# Patient Record
Sex: Male | Born: 1985 | Race: White | Hispanic: No | Marital: Single | Smoking: Former smoker
Health system: Southern US, Community
[De-identification: ages and names within clinical notes are randomized; demographics above are authoritative.]

## PROBLEM LIST (undated history)

## (undated) DIAGNOSIS — M109 Gout, unspecified: Secondary | ICD-10-CM

## (undated) DIAGNOSIS — I10 Essential (primary) hypertension: Secondary | ICD-10-CM

## (undated) HISTORY — DX: Gout, unspecified: M10.9

---

## 2010-06-10 ENCOUNTER — Emergency Department (HOSPITAL_COMMUNITY): Admission: EM | Admit: 2010-06-10 | Discharge: 2010-06-10 | Payer: Self-pay | Admitting: Emergency Medicine

## 2015-02-02 ENCOUNTER — Encounter: Payer: Self-pay | Admitting: Podiatry

## 2015-02-02 ENCOUNTER — Ambulatory Visit (INDEPENDENT_AMBULATORY_CARE_PROVIDER_SITE_OTHER): Payer: PRIVATE HEALTH INSURANCE

## 2015-02-02 ENCOUNTER — Ambulatory Visit (INDEPENDENT_AMBULATORY_CARE_PROVIDER_SITE_OTHER): Payer: PRIVATE HEALTH INSURANCE | Admitting: Podiatry

## 2015-02-02 VITALS — BP 139/91 | HR 77 | Resp 16 | Ht 74.0 in | Wt 220.0 lb

## 2015-02-02 DIAGNOSIS — M779 Enthesopathy, unspecified: Secondary | ICD-10-CM

## 2015-02-02 DIAGNOSIS — M79672 Pain in left foot: Secondary | ICD-10-CM

## 2015-02-02 DIAGNOSIS — M1A0720 Idiopathic chronic gout, left ankle and foot, without tophus (tophi): Secondary | ICD-10-CM

## 2015-02-02 MED ORDER — INDOMETHACIN 50 MG PO CAPS: 50.0000 mg | ORAL_CAPSULE | Freq: Two times a day (BID) | ORAL | Status: DC

## 2015-02-02 NOTE — Patient Instructions (Signed)
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
## 2015-02-03 LAB — ANA: ANA: NEGATIVE

## 2015-02-03 LAB — C-REACTIVE PROTEIN: CRP: 9.6 mg/L — AB (ref 0.0–4.9)

## 2015-02-03 LAB — RHEUMATOID FACTOR: Rhuematoid fact SerPl-aCnc: <7 IU/mL (ref 0.0–13.9)

## 2015-02-03 LAB — URIC ACID: Uric Acid: 7.9 mg/dL (ref 3.7–8.6)

## 2015-02-03 LAB — SEDIMENTATION RATE: Sed Rate: 4 mm/hr (ref 0–15)

## 2015-02-05 ENCOUNTER — Other Ambulatory Visit: Payer: Self-pay | Admitting: Podiatry

## 2015-02-05 MED ORDER — COLCHICINE 0.6 MG PO TABS: 0.6000 mg | ORAL_TABLET | Freq: Every day | ORAL | Status: DC

## 2015-05-23 ENCOUNTER — Other Ambulatory Visit: Payer: Self-pay | Admitting: Podiatry

## 2015-05-23 MED ORDER — INDOMETHACIN 50 MG PO CAPS: 50.0000 mg | ORAL_CAPSULE | Freq: Two times a day (BID) | ORAL | Status: DC

## 2015-05-24 ENCOUNTER — Other Ambulatory Visit: Payer: Self-pay | Admitting: Podiatry

## 2016-05-06 ENCOUNTER — Other Ambulatory Visit: Payer: Self-pay | Admitting: Podiatry

## 2016-10-16 ENCOUNTER — Encounter: Payer: Self-pay | Admitting: Podiatry

## 2016-10-16 ENCOUNTER — Ambulatory Visit (INDEPENDENT_AMBULATORY_CARE_PROVIDER_SITE_OTHER): Payer: 59 | Admitting: Podiatry

## 2016-10-16 DIAGNOSIS — M7752 Other enthesopathy of left foot: Secondary | ICD-10-CM

## 2016-10-16 DIAGNOSIS — M10072 Idiopathic gout, left ankle and foot: Secondary | ICD-10-CM | POA: Diagnosis not present

## 2016-10-16 DIAGNOSIS — R600 Localized edema: Secondary | ICD-10-CM

## 2016-10-16 MED ORDER — COLCHICINE 0.6 MG PO TABS: 0.6000 mg | ORAL_TABLET | Freq: Every day | ORAL | 1 refills | Status: DC

## 2016-10-16 MED ORDER — BETAMETHASONE SOD PHOS & ACET 6 (3-3) MG/ML IJ SUSP: 3.0000 mg | Freq: Once | INTRAMUSCULAR | Status: DC

## 2016-10-16 MED ORDER — ALLOPURINOL 100 MG PO TABS: 100.0000 mg | ORAL_TABLET | Freq: Every day | ORAL | 6 refills | Status: DC

## 2016-10-16 NOTE — Progress Notes (Signed)
   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 Patient believes she has a gout attack to the left ankle joint. Patient denies trauma and states the pains been going on for several weeks now. Patient has been taking colchicine and indomethacin without any alleviation of symptoms. 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 Uric acid levels were taken on 02/02/2015. Results were 7.9 mg/dL consistent with elevated uric acid levels.    Objective/Physical Exam General: The patient is alert and oriented x3 in no acute distress.  Dermatology: Skin is warm, dry and supple bilateral lower extremities. Negative for open lesions or macerations.  Vascular: Palpable pedal pulses bilaterally. No edema or erythema noted. Capillary refill within normal limits.  Neurological: Epicritic and protective threshold grossly intact bilaterally.   Musculoskeletal Exam: Pain on palpation noted to the anterior medial and lateral aspect of the patient's left ankle joint as well as edema noted to the left ankle joint. Range of motion within normal limits to all pedal and ankle joints bilateral. Muscle strength 5/5 in all groups bilateral.   Assessment: #1 acute gout left ankle joint   Plan of Care:  #1 Patient was evaluated. #2 injection 0.5 mL Celestone Soluspan injected in the patient's right ankle joint #3 prescription for Colcrys0.6 mg #4 prescription for allopurinol #5 today were going to refer the patient to a rheumatologist for rheumatology consult. #6 return to clinic when necessary   Felecia ShellingBrent M. Evans, DPM [facility]  Dr. Felecia ShellingBrent M. Evans, DPM    [address]                                        [place], [place] [address]                Office [phone]  Fax [phone]

## 2016-10-17 ENCOUNTER — Telehealth: Payer: Self-pay | Admitting: *Deleted

## 2016-10-17 NOTE — Telephone Encounter (Addendum)
-----   Message from Felecia ShellingBrent M Evans, DPM sent at 10/16/2016  9:49 PM EST ----- Regarding: Rheumatology consult Please refer patient to Rheumatology consult, Dr. Alben DeedsJames Beekman, Rheumatologist.  Dx: recurrent acute gout attacks left lower extremity  Thanks, Dr. Logan BoresEvans. 10/17/2016-Faxed required form, clinicals and demographics [facility].

## 2017-05-14 ENCOUNTER — Ambulatory Visit: Payer: 59 | Admitting: Podiatry

## 2017-06-15 ENCOUNTER — Other Ambulatory Visit: Payer: Self-pay | Admitting: Podiatry

## 2019-01-01 ENCOUNTER — Emergency Department: Payer: Self-pay

## 2019-01-01 ENCOUNTER — Encounter: Payer: Self-pay | Admitting: Emergency Medicine

## 2019-01-01 ENCOUNTER — Emergency Department
Admission: EM | Admit: 2019-01-01 | Discharge: 2019-01-01 | Disposition: A | Discharge status: Home / Self Care | Payer: Self-pay | Attending: Student in an Organized Health Care Education/Training Program | Admitting: Student in an Organized Health Care Education/Training Program

## 2019-01-01 ENCOUNTER — Other Ambulatory Visit: Payer: Self-pay

## 2019-01-01 DIAGNOSIS — F17210 Nicotine dependence, cigarettes, uncomplicated: Secondary | ICD-10-CM | POA: Insufficient documentation

## 2019-01-01 DIAGNOSIS — R569 Unspecified convulsions: Secondary | ICD-10-CM | POA: Insufficient documentation

## 2019-01-01 DIAGNOSIS — I10 Essential (primary) hypertension: Secondary | ICD-10-CM | POA: Insufficient documentation

## 2019-01-01 HISTORY — DX: Essential (primary) hypertension: I10

## 2019-01-01 LAB — URINALYSIS, COMPLETE (UACMP) WITH MICROSCOPIC
Bacteria, UA: NONE SEEN
Bilirubin Urine: NEGATIVE
Glucose, UA: NEGATIVE mg/dL
Hgb urine dipstick: NEGATIVE
Ketones, ur: 5 mg/dL — AB
Leukocytes,Ua: NEGATIVE
Nitrite: NEGATIVE
Protein, ur: NEGATIVE mg/dL
Specific Gravity, Urine: 1.017 (ref 1.005–1.030)
pH: 5 (ref 5.0–8.0)

## 2019-01-01 LAB — CBC WITH DIFFERENTIAL/PLATELET
Abs Immature Granulocytes: 0.12 10*3/uL — ABNORMAL HIGH (ref 0.00–0.07)
Basophils Absolute: 0.1 10*3/uL (ref 0.0–0.1)
Basophils Relative: 1 %
Eosinophils Absolute: 0 10*3/uL (ref 0.0–0.5)
Eosinophils Relative: 0 %
HCT: 46.1 % (ref 39.0–52.0)
Hemoglobin: 16.1 g/dL (ref 13.0–17.0)
Immature Granulocytes: 1 %
Lymphocytes Relative: 13 %
Lymphs Abs: 1.6 10*3/uL (ref 0.7–4.0)
MCH: 31.3 pg (ref 26.0–34.0)
MCHC: 34.9 g/dL (ref 30.0–36.0)
MCV: 89.5 fL (ref 80.0–100.0)
Monocytes Absolute: 0.6 10*3/uL (ref 0.1–1.0)
Monocytes Relative: 5 %
Neutro Abs: 9.6 10*3/uL — ABNORMAL HIGH (ref 1.7–7.7)
Neutrophils Relative %: 80 %
Platelets: 212 10*3/uL (ref 150–400)
RBC: 5.15 MIL/uL (ref 4.22–5.81)
RDW: 13.4 % (ref 11.5–15.5)
WBC: 12 10*3/uL — ABNORMAL HIGH (ref 4.0–10.5)
nRBC: 0 % (ref 0.0–0.2)

## 2019-01-01 LAB — COMPREHENSIVE METABOLIC PANEL
ALT: 33 U/L (ref 0–44)
AST: 42 U/L — ABNORMAL HIGH (ref 15–41)
Albumin: 4.4 g/dL (ref 3.5–5.0)
Alkaline Phosphatase: 67 U/L (ref 38–126)
Anion gap: 14 (ref 5–15)
BUN: 6 mg/dL (ref 6–20)
CO2: 20 mmol/L — ABNORMAL LOW (ref 22–32)
Calcium: 8.9 mg/dL (ref 8.9–10.3)
Chloride: 103 mmol/L (ref 98–111)
Creatinine, Ser: 1 mg/dL (ref 0.61–1.24)
GFR calc Af Amer: >60 mL/min (ref >60)
GFR calc non Af Amer: >60 mL/min (ref >60)
Glucose, Bld: 125 mg/dL — ABNORMAL HIGH (ref 70–99)
Potassium: 3.3 mmol/L — ABNORMAL LOW (ref 3.5–5.1)
Sodium: 137 mmol/L (ref 135–145)
Total Bilirubin: 0.7 mg/dL (ref 0.3–1.2)
Total Protein: 7.6 g/dL (ref 6.5–8.1)

## 2019-01-01 LAB — URINE DRUG SCREEN, QUALITATIVE (ARMC ONLY)
Amphetamines, Ur Screen: NOT DETECTED
Barbiturates, Ur Screen: NOT DETECTED
Benzodiazepine, Ur Scrn: POSITIVE — AB
Cannabinoid 50 Ng, Ur ~~LOC~~: POSITIVE — AB
Cocaine Metabolite,Ur ~~LOC~~: NOT DETECTED
MDMA (Ecstasy)Ur Screen: NOT DETECTED
Methadone Scn, Ur: NOT DETECTED
Opiate, Ur Screen: NOT DETECTED
Phencyclidine (PCP) Ur S: NOT DETECTED
Tricyclic, Ur Screen: NOT DETECTED

## 2019-01-01 MED ORDER — IOHEXOL 300 MG/ML  SOLN
75.0000 mL | Freq: Once | INTRAMUSCULAR | Status: AC | PRN
  Administered 2019-01-01: 75 mL via INTRAVENOUS

## 2019-01-01 MED ORDER — LORAZEPAM 1 MG PO TABS
1.0000 mg | ORAL_TABLET | Freq: Once | ORAL | Status: AC
  Administered 2019-01-01: 1 mg via ORAL
  Filled 2019-01-01: qty 1

## 2019-01-01 NOTE — ED Provider Notes (Signed)
[facility] Emergency Department Provider Note    First MD Initiated Contact with Patient 01/01/19 [phone]     (approximate)  I have reviewed the triage vital signs and the nursing notes.   HISTORY  Chief Complaint Seizures    HPI Martin Foley is a 33 y.o. male [place] past medical history presents the ER after witnessed seizure-like episode that occurred while patient was asleep.  States he feels fine right now does have a mild headache as well as some mild left-sided chest pain.  States episode occurred while he was sleeping does not recall this episode was witnessed by the patient's spouse.  Was generalized shaking episode lasting a few minutes.  He did bite his tongue.  Did not lose control of his bladder.  Had another episode this morning where he felt he was shaking all over for several seconds.  Did not lose consciousness during this episode.  States his last drink of alcohol was 3 days ago.  Did not ingest any other medications or street drugs in the past 24 hours.  Denies any history of seizure.    Past Medical History:  Diagnosis Date   Hypertension    No family history on file. History reviewed. No pertinent surgical history. There are no active problems to display for this patient.     Prior to Admission medications   Not on File    Allergies Patient has no known allergies.    Social History Social History   Tobacco Use   Smoking status: Current Every Day Smoker    Packs/day: 0.25    Types: Cigarettes   Smokeless tobacco: Never Used  Substance Use Topics   Alcohol use: Yes    Alcohol/week: 0.0 standard drinks    Comment: weekends   Drug use: Not on file    Review of Systems Patient denies headaches, rhinorrhea, blurry vision, numbness, shortness of breath, chest pain, edema, cough, abdominal pain, nausea, vomiting, diarrhea, dysuria, fevers, rashes or hallucinations unless otherwise stated above in  HPI. ____________________________________________   PHYSICAL EXAM:  VITAL SIGNS: Vitals:   01/01/19 0847  BP: (!) 136/96  Pulse: (!) 101  Resp: 14  Temp: 98.9 F (37.2 C)  SpO2: 93%    Constitutional: Alert and oriented.  Eyes: Conjunctivae are normal.  Head: Atraumatic. Nose: No congestion/rhinnorhea. Mouth/Throat: Mucous membranes are moist.  Bite marks to right side of tongue   Neck: No stridor. Painless ROM.  Cardiovascular: Normal rate, regular rhythm. Grossly normal heart sounds.  Good peripheral circulation. Respiratory: Normal respiratory effort.  No retractions. Lungs CTAB. Gastrointestinal: Soft and nontender. No distention. No abdominal bruits. No CVA tenderness. Genitourinary: deferred Musculoskeletal: No lower extremity tenderness nor edema.  No joint effusions. Neurologic:  CN- intact.  No facial droop, Normal FNF.  Normal heel to shin.  Sensation intact bilaterally. Normal speech and language. No gross focal neurologic deficits are appreciated. No gait instability. Skin:  Skin is warm, dry and intact. No rash noted. Psychiatric: Mood and affect are normal. Speech and behavior are normal.  ____________________________________________   LABS (all labs ordered are listed, but only abnormal results are displayed)  Results for orders placed or performed during the hospital encounter of 01/01/19 (from the past 24 hour(s))  CBC with Differential/Platelet     Status: Abnormal   Collection Time: 01/01/19  8:45 AM  Result Value Ref Range   WBC 12.0 (H) 4.0 - 10.5 K/uL   RBC 5.15 4.22 - 5.81 MIL/uL   Hemoglobin  16.1 13.0 - 17.0 g/dL   HCT 81.8 56.3 - 14.9 %   MCV 89.5 80.0 - 100.0 fL   MCH 31.3 26.0 - 34.0 pg   MCHC 34.9 30.0 - 36.0 g/dL   RDW 70.2 63.7 - 85.8 %   Platelets 212 150 - 400 K/uL   nRBC 0.0 0.0 - 0.2 %   Neutrophils Relative % 80 %   Neutro Abs 9.6 (H) 1.7 - 7.7 K/uL   Lymphocytes Relative 13 %   Lymphs Abs 1.6 0.7 - 4.0 K/uL   Monocytes  Relative 5 %   Monocytes Absolute 0.6 0.1 - 1.0 K/uL   Eosinophils Relative 0 %   Eosinophils Absolute 0.0 0.0 - 0.5 K/uL   Basophils Relative 1 %   Basophils Absolute 0.1 0.0 - 0.1 K/uL   Immature Granulocytes 1 %   Abs Immature Granulocytes 0.12 (H) 0.00 - 0.07 K/uL  Comprehensive metabolic panel     Status: Abnormal   Collection Time: 01/01/19  8:45 AM  Result Value Ref Range   Sodium 137 135 - 145 mmol/L   Potassium 3.3 (L) 3.5 - 5.1 mmol/L   Chloride 103 98 - 111 mmol/L   CO2 20 (L) 22 - 32 mmol/L   Glucose, Bld 125 (H) 70 - 99 mg/dL   BUN 6 6 - 20 mg/dL   Creatinine, Ser 8.50 0.61 - 1.24 mg/dL   Calcium 8.9 8.9 - 27.7 mg/dL   Total Protein 7.6 6.5 - 8.1 g/dL   Albumin 4.4 3.5 - 5.0 g/dL   AST 42 (H) 15 - 41 U/L   ALT 33 0 - 44 U/L   Alkaline Phosphatase 67 38 - 126 U/L   Total Bilirubin 0.7 0.3 - 1.2 mg/dL   GFR calc non Af Amer >60 >60 mL/min   GFR calc Af Amer >60 >60 mL/min   Anion gap 14 5 - 15  Urinalysis, Complete w Microscopic     Status: Abnormal   Collection Time: 01/01/19  8:45 AM  Result Value Ref Range   Color, Urine YELLOW (A) YELLOW   APPearance CLEAR (A) CLEAR   Specific Gravity, Urine 1.017 1.005 - 1.030   pH 5.0 5.0 - 8.0   Glucose, UA NEGATIVE NEGATIVE mg/dL   Hgb urine dipstick NEGATIVE NEGATIVE   Bilirubin Urine NEGATIVE NEGATIVE   Ketones, ur 5 (A) NEGATIVE mg/dL   Protein, ur NEGATIVE NEGATIVE mg/dL   Nitrite NEGATIVE NEGATIVE   Leukocytes,Ua NEGATIVE NEGATIVE   WBC, UA 0-5 0 - 5 WBC/hpf   Bacteria, UA NONE SEEN NONE SEEN   Squamous Epithelial / LPF 0-5 0 - 5   Mucus PRESENT    Hyaline Casts, UA PRESENT   Urine Drug Screen, Qualitative ([facility] only)     Status: Abnormal   Collection Time: 01/01/19  8:45 AM  Result Value Ref Range   Tricyclic, Ur Screen NONE DETECTED NONE DETECTED   Amphetamines, Ur Screen NONE DETECTED NONE DETECTED   MDMA (Ecstasy)Ur Screen NONE DETECTED NONE DETECTED   Cocaine Metabolite,Ur [place] NONE DETECTED NONE  DETECTED   Opiate, Ur Screen NONE DETECTED NONE DETECTED   Phencyclidine (PCP) Ur S NONE DETECTED NONE DETECTED   Cannabinoid 50 Ng, Ur [place] POSITIVE (A) NONE DETECTED   Barbiturates, Ur Screen NONE DETECTED NONE DETECTED   Benzodiazepine, Ur Scrn POSITIVE (A) NONE DETECTED   Methadone Scn, Ur NONE DETECTED NONE DETECTED   ____________________________________________  EKG My review and personal interpretation at Time: 8:47   Indication:  sz like activity  Rate: 100  Rhythm: sinus Axis: normal Other: normal intervals, no stemi, no wpw or brugada ____________________________________________  RADIOLOGY  I personally reviewed all radiographic images ordered to evaluate for the above acute complaints and reviewed radiology reports and findings.  These findings were personally discussed with the patient.  Please see medical record for radiology report.  ____________________________________________   PROCEDURES  Procedure(s) performed:  Procedures    Critical Care performed: no ____________________________________________   INITIAL IMPRESSION / ASSESSMENT AND PLAN / ED COURSE  Pertinent labs & imaging results that were available during my care of the patient were reviewed by me and considered in my medical decision making (see chart for details).   DDX: seizure, epilepsy, electrolyte abn, mass, medication effect  Martin Foley is a 33 y.o. who presents to the ED with with symptoms as described above.  He is currently afebrile mildly tachycardic.  He is well-appearing and protecting his airway.  No evidence of status epilepticus as he is currently at his baseline.  Blood work and CT imaging will be ordered for the above differential.  The patient will be placed on continuous pulse oximetry and telemetry for monitoring.  Laboratory evaluation will be sent to evaluate for the above complaints.        ----------------------------------------- 10:18 AM on  01/01/2019 -----------------------------------------  Reassessed.  Repeat neuro exam nonfocal.  Neuro imaging is reassuring.  Mild leukocytosis of unspecified etiology.  He does not have any meningismus or signs or symptoms to suggest infectious etiology.  Possible stress reaction.  Discussed possibility of substance-induced episode.  Also discussed importance of no driving or swimming and general seizure first aid.  Gust the importance of follow-up with neurology. Have discussed with the patient and available family all diagnostics and treatments performed thus far and all questions were answered to the best of my ability. The patient demonstrates understanding and agreement with plan.   The patient was evaluated in Emergency Department today for the symptoms described in the history of present illness. He/she was evaluated in the context of the global COVID-19 pandemic, which necessitated consideration that the patient might be at risk for infection with the SARS-CoV-2 virus that causes COVID-19. Institutional protocols and algorithms that pertain to the evaluation of patients at risk for COVID-19 are in a state of rapid change based on information released by regulatory bodies including the CDC and federal and state organizations. These policies and algorithms were followed during the patient's care in the ED.  As part of my medical decision making, I reviewed the following data within the electronic MEDICAL RECORD NUMBER Nursing notes reviewed and incorporated, Labs reviewed, notes from prior ED visits and [place] Controlled Substance Database   ____________________________________________   FINAL CLINICAL IMPRESSION(S) / ED DIAGNOSES  Final diagnoses:  Seizure-like activity (HCC)      NEW MEDICATIONS STARTED DURING THIS VISIT:  New Prescriptions   No medications on file     Note:  This document was prepared using Dragon voice recognition software and may include unintentional dictation  errors.    Willy Eddy, MD 01/01/19 1019

## 2019-01-01 NOTE — Discharge Instructions (Addendum)
Do not operate heavy machine, drive or swim/take a bath by yourself until you are evaluated by neurology.

## 2019-01-01 NOTE — ED Notes (Signed)
NAD noted at time of D/C. Pt denies comments/concerns regarding D/C instructions. Pt ambulatory to the lobby at time of D/C, D/C into the care of his SO.

## 2019-01-01 NOTE — ED Triage Notes (Signed)
Pt does not have a history of seizures.

## 2019-01-01 NOTE — ED Triage Notes (Signed)
Pt girlfriend reports pt has a seizure last pm and would not be checked out and then had another this am. Pt denies pain but girlfriend reports pt c/o cp. Pt face red and flushed.

## 2020-05-18 IMAGING — CT CT HEAD WITHOUT AND WITH CONTRAST
3 of 4 series · 15 of 47 positions shown, 18 images · IV contrast (omnipaque)
Comparison: None.
COMPARISON: None.

CLINICAL DATA: New onset seizures.

EXAM:
CT HEAD WITHOUT AND WITH CONTRAST
TECHNIQUE: Contiguous axial images were obtained from the base of the skull
through the vertex without and with intravenous contrast
CONTRAST:  75mL OMNIPAQUE IOHEXOL 300 MG/ML  SOLN

[Series 2: head wo · axial · 0.43mm/px · z∈[-80,+45]mm · 9 of 31 slices shown, 12 images]
[im 3/31  brain]
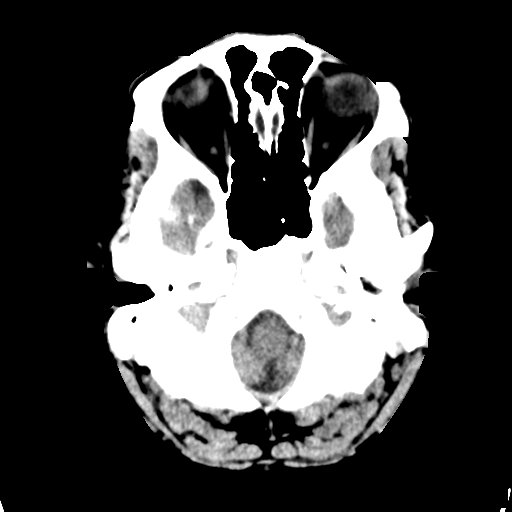
[im 3/31  bone]
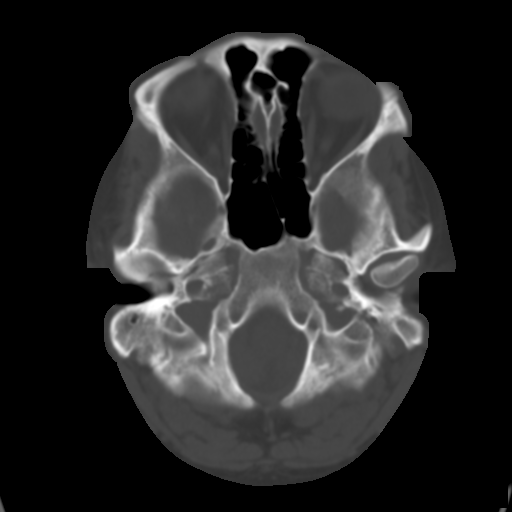
[im 7/31  brain]
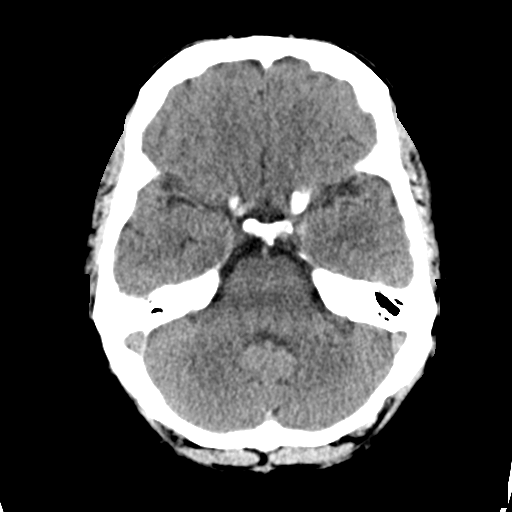
[im 9/31  brain]
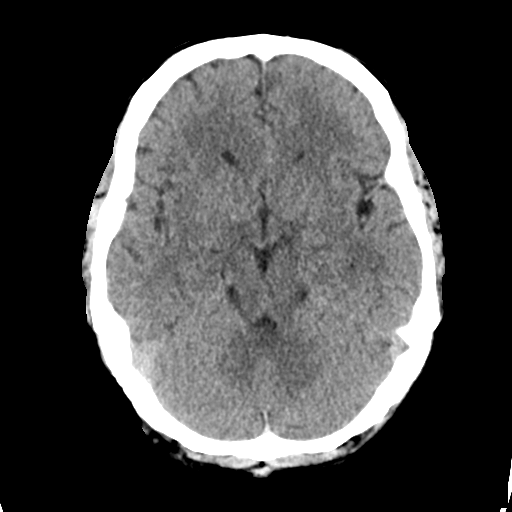
[im 13/31  brain]
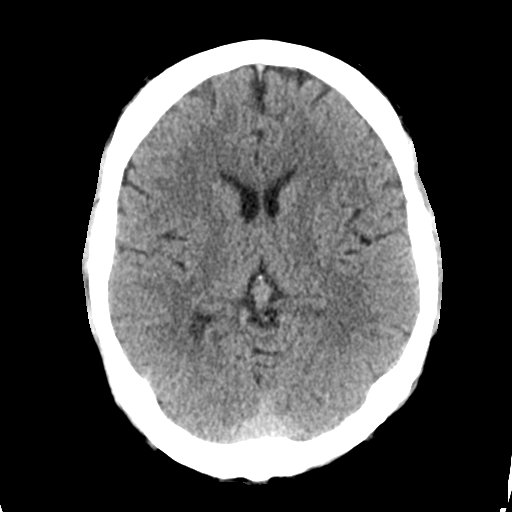
[im 16/31  brain]
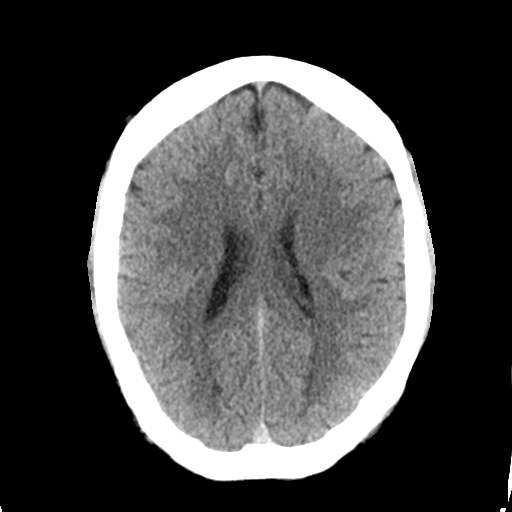
[im 16/31  bone]
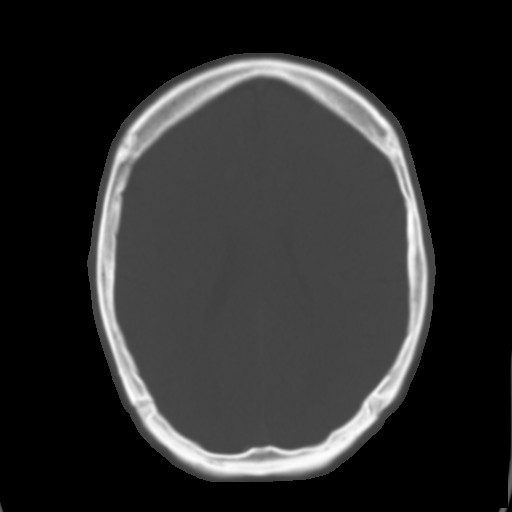
[im 18/31  brain]
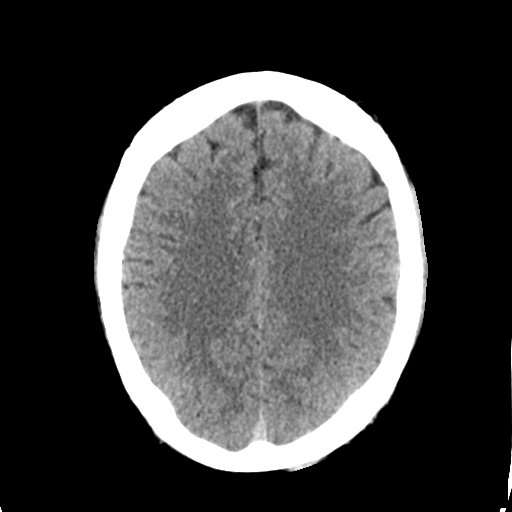
[im 22/31  brain]
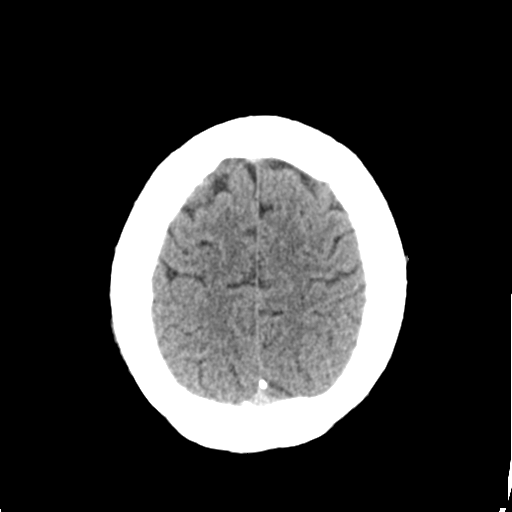
[im 24/31  brain]
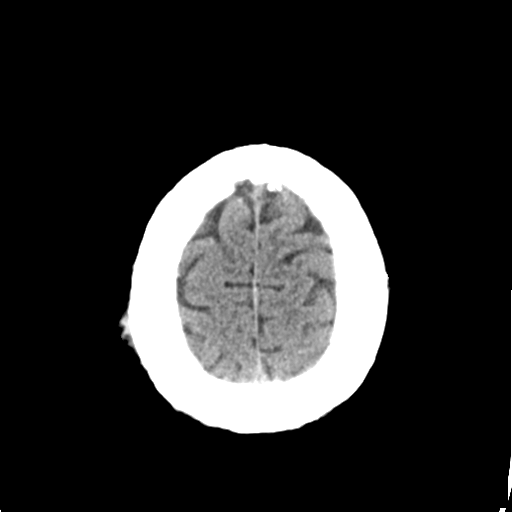
[im 28/31  brain]
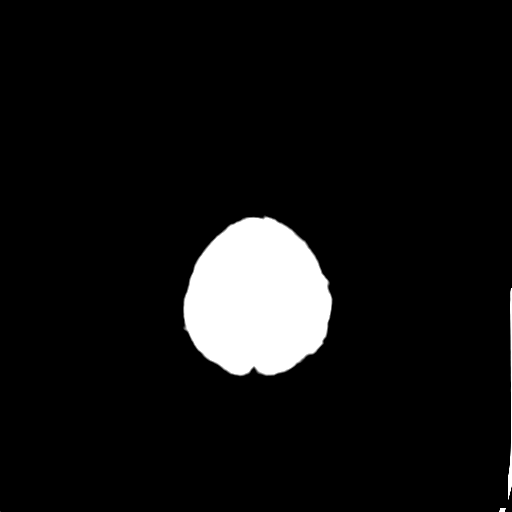
[im 28/31  bone]
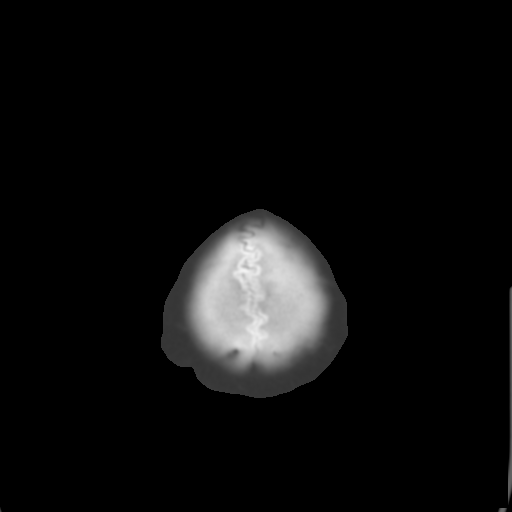

[Series 5: coronal soft tissue · coronal · 0.34mm/px · 3 of 67 slices shown]
[im 23/67  brain]
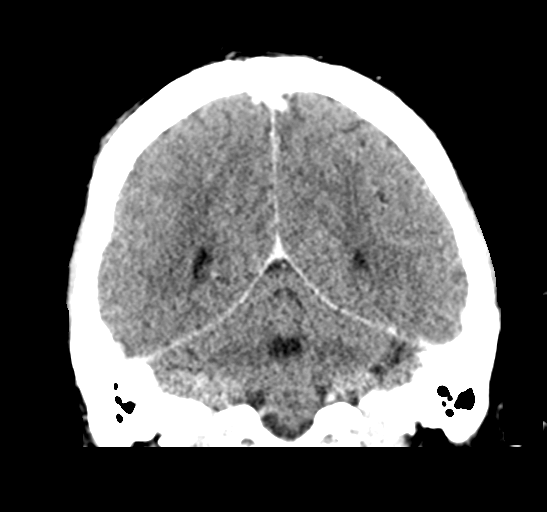
[im 30/67  brain]
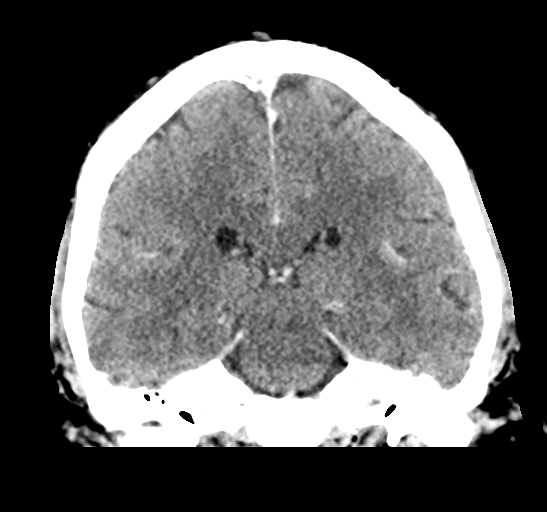
[im 37/67  brain]
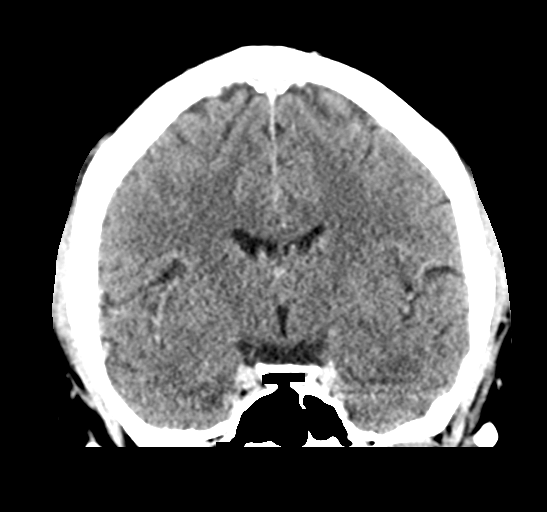

[Series 6: sagittal soft tissue · sagittal · 0.33mm/px · 3 of 59 slices shown]
[im 20/59  brain]
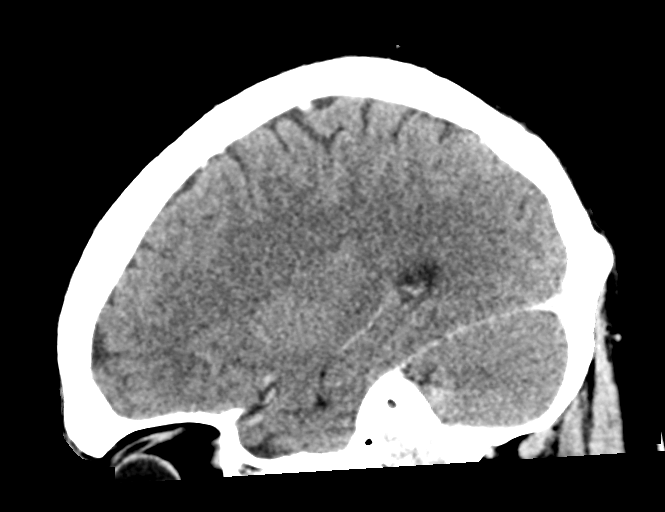
[im 30/59  brain]
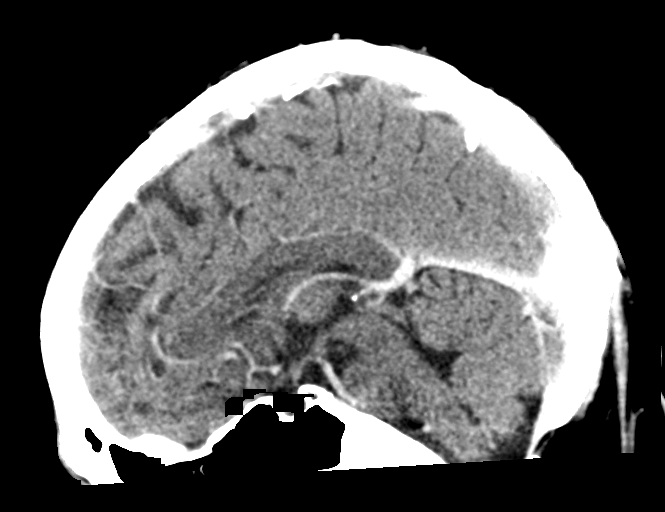
[im 39/59  brain]
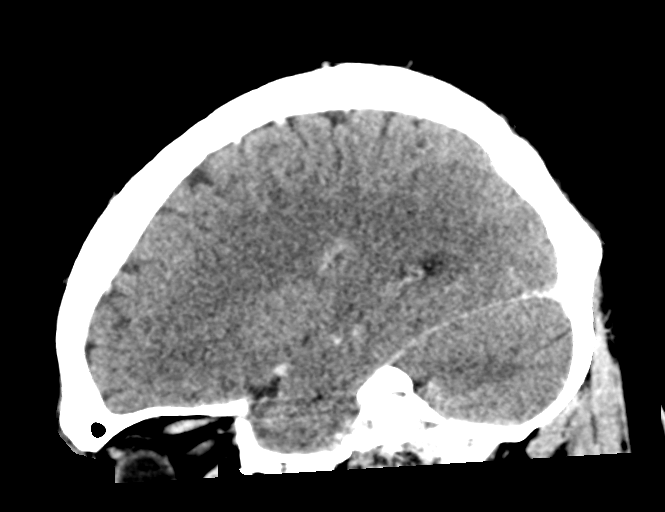

[15 of 47 positions shown; findings below may reference images not displayed]

FINDINGS: Brain: There is no evidence of acute infarct, intracranial
hemorrhage, mass, midline shift, or extra-axial fluid collection.
The ventricles and sulci are normal. No abnormal enhancement is
identified.

Vascular: Large arteries at the base the brain and major dural
venous sinuses are grossly patent.

Skull: No fracture or focal osseous lesion.

Sinuses/Orbits: Visualized paranasal sinuses and mastoid air cells
are clear. Visualized orbits are unremarkable.

Other: None.
FINDINGS: Brain: There is no evidence of acute infarct, intracranial
hemorrhage, mass, midline shift, or extra-axial fluid collection.
The ventricles and sulci are normal. No abnormal enhancement is
identified.

Vascular: Large arteries at the base the brain and major dural
venous sinuses are grossly patent.

Skull: No fracture or focal osseous lesion.

Sinuses/Orbits: Visualized paranasal sinuses and mastoid air cells
are clear. Visualized orbits are unremarkable.

Other: None.
IMPRESSION: Negative head CT without and with contrast.

## 2020-10-25 ENCOUNTER — Other Ambulatory Visit: Payer: Self-pay | Admitting: *Deleted

## 2020-10-26 ENCOUNTER — Telehealth: Payer: Self-pay | Admitting: *Deleted

## 2020-10-26 ENCOUNTER — Telehealth: Payer: Self-pay

## 2020-10-26 NOTE — Telephone Encounter (Signed)
[facility] is requesting a medication colchinine 0.6 mg. Please call [phone] or fax prescription to [phone].  Called pharmacy and explained that the patient has not been seen in our office 2018. She verbalized understanding and stated that they will update system.

## 2020-10-26 NOTE — Telephone Encounter (Signed)
Patient called nurse line left message stating that he was having a gout flare up and requested a refill of Colchicine.  I returned his call and left voice message that he has not been seen since 2018 and he would need an appt before any medication is prescribed.  I instructed him to call office in am to be worked in or he can go to an Urgent care for treatment.

## 2021-04-14 ENCOUNTER — Ambulatory Visit: Payer: 59 | Admitting: Podiatry

## 2021-12-19 ENCOUNTER — Ambulatory Visit: Payer: Self-pay | Admitting: Podiatry

## 2023-04-09 ENCOUNTER — Ambulatory Visit: Payer: Self-pay | Admitting: Podiatry

## 2024-07-15 ENCOUNTER — Ambulatory Visit: Payer: Self-pay

## 2024-07-15 NOTE — Telephone Encounter (Signed)
 FYI Only or Action Required?: FYI only for provider: appointment scheduled on 11/20.  Patient was last seen in primary care on New Patient.  Called Nurse Triage reporting Leg Swelling and Gout.  Symptoms began a week ago.  Interventions attempted: Rest, hydration, or home remedies.  Symptoms are: gradually worsening.  Triage Disposition: See Physician Within 24 Hours  Patient/caregiver understands and will follow disposition?: Yes      Copied from CRM [id number]. Topic: Clinical - Red Word Triage >> Jul 15, 2024  2:25 PM Roselie BROCKS wrote: Red Word that prompted transfer to Nurse Triage: Patient called to set up new patient appointment with Toribio Hoyle, but stated he has gout, and his left leg and knee is extremely swollen, and it will not go down at all. And patient does not have any medication. Reason for Disposition  [1] MODERATE leg swelling (e.g., swelling extends up to knees) AND [2] new-onset or getting worse  Answer Assessment - Initial Assessment Questions 1. ONSET: When did the swelling start? (e.g., minutes, hours, days)     X 1 week   2. LOCATION: What part of the leg is swollen?  Are both legs swollen or just one leg?     Left leg- ankle as well as left knee , swelling has persisted   3. SEVERITY: How bad is the swelling? (e.g., localized; mild, moderate, severe)     Moderate   4. REDNESS: Is there redness or signs of infection?     No redness, or signs of infection   5. PAIN: Is the swelling painful to touch? If Yes, ask: How painful is it?   (Scale 1-10; mild, moderate or severe)      Pain from the gout with movement- moderate   6. FEVER: Do you have a fever? If Yes, ask: What is it, how was it measured, and when did it start?      Not currently    7. CAUSE: What do you think is causing the leg swelling?     Patient has history of gout   8. MEDICAL HISTORY: Do you have a history of blood clots (e.g., DVT), cancer, heart failure,  kidney disease, or liver failure?     No   9. RECURRENT SYMPTOM: Have you had leg swelling before? If Yes, ask: When was the last time? What happened that time?     Yes  10. OTHER SYMPTOMS: Do you have any other symptoms? (e.g., chest pain, difficulty breathing)  Patient called in with complaints of gout, and his left leg and knee is extremely swollen, patient is not currently on prescription medication for the gout. Patient is not established with [facility], New Patient appt is 11/20 . Patient agrees with plan of care, and will call back if anything changes, or if symptoms worsen.  Protocols used: Leg Swelling and Edema-A-AH

## 2024-07-16 ENCOUNTER — Ambulatory Visit: Payer: Self-pay | Admitting: Family Medicine

## 2024-07-20 ENCOUNTER — Ambulatory Visit (INDEPENDENT_AMBULATORY_CARE_PROVIDER_SITE_OTHER): Payer: Self-pay | Admitting: Physician Assistant

## 2024-07-20 ENCOUNTER — Encounter: Payer: Self-pay | Admitting: Physician Assistant

## 2024-07-20 ENCOUNTER — Other Ambulatory Visit: Payer: Self-pay

## 2024-07-20 VITALS — BP 158/96 | HR 97 | Temp 98.7°F | Ht 74.0 in | Wt 225.0 lb

## 2024-07-20 DIAGNOSIS — M79662 Pain in left lower leg: Secondary | ICD-10-CM

## 2024-07-20 DIAGNOSIS — M1A9XX0 Chronic gout, unspecified, without tophus (tophi): Secondary | ICD-10-CM | POA: Insufficient documentation

## 2024-07-20 DIAGNOSIS — R030 Elevated blood-pressure reading, without diagnosis of hypertension: Secondary | ICD-10-CM | POA: Insufficient documentation

## 2024-07-20 DIAGNOSIS — M109 Gout, unspecified: Secondary | ICD-10-CM

## 2024-07-20 MED ORDER — IBUPROFEN 800 MG PO TABS
800.0000 mg | ORAL_TABLET | Freq: Three times a day (TID) | ORAL | 1 refills | Status: AC | PRN
  Filled 2024-07-20: qty 30, 10d supply, fill #0
  Filled 2024-08-03 – 2024-08-15 (×2): qty 30, 10d supply, fill #1

## 2024-07-20 MED ORDER — ALLOPURINOL 300 MG PO TABS
300.0000 mg | ORAL_TABLET | Freq: Every day | ORAL | 1 refills | Status: AC
  Filled 2024-07-20: qty 90, 90d supply, fill #0

## 2024-07-20 MED ORDER — COLCHICINE 0.6 MG PO TABS
0.6000 mg | ORAL_TABLET | ORAL | 1 refills | Status: AC | PRN
  Filled 2024-07-20: qty 15, 5d supply, fill #0
  Filled 2024-08-03 – 2024-08-15 (×2): qty 15, 5d supply, fill #1

## 2024-07-20 NOTE — Progress Notes (Signed)
 Date:  07/20/2024   Name:  Martin Foley   DOB:  1986/05/10   MRN:  [id number]   Chief Complaint: Establish Care and Gout (Does not have insurance, has been taking medication from someone else, gout attack in left leg in knee and foot, calf is swollen, red and warm to touch )  HPI  Martin Foley is a very pleasant self-pay 38 y.o. male with history of gout who presents new to the clinic today for evaluation of recent gout flare which has been gradually worsening over the last 2 weeks, though seems to have let up some for the last 24 to 48 hours.  Began in his left ankle, which is his usual site, but then a week later began to involve the left knee, and most recently the left calf which he feels like is tight when he tries to walk.  No recent immobilizations, long plane/train ride, prior DVT, or known malignancy. He endorses gout flares every 1 to 2 months, not presently on allopurinol  as preventative but has been taking a friend's allopurinol  for the last few days. He does not drink much alcohol or eat red meat.  Has previously taken colchicine  but not for the current episode.  Medication list has been reviewed and updated.  Current Meds  Medication Sig   allopurinol  (ZYLOPRIM ) 300 MG tablet Take 1 tablet (300 mg total) by mouth daily.   colchicine  0.6 MG tablet Take 1 tablet (0.6 mg total) by mouth as needed (Take only as directed at the onset of gout flare). Day 1: Take 2 tablets at once. After 1 hour, take a third tablet. Days 2-5: Take 1 tablet every 12h until flare resolved   ibuprofen  (ADVIL ) 800 MG tablet Take 1 tablet (800 mg total) by mouth every 8 (eight) hours as needed.   [DISCONTINUED] ALLOPURINOL  PO Take by mouth.     Review of Systems  Patient Active Problem List   Diagnosis Date Noted   Elevated blood pressure reading in office without diagnosis of hypertension 07/20/2024    No Known Allergies   There is no immunization history on file for this patient.  History  reviewed. No pertinent surgical history.  Social History   Tobacco Use   Smoking status: Former    Current packs/day: 0.00    Average packs/day: 0.3 packs/day for 18.0 years (4.5 ttl pk-yrs)    Types: Cigarettes    Start date: 2005    Quit date: 2023    Years since quitting: 2.8   Smokeless tobacco: Never  Vaping Use   Vaping status: Never Used  Substance Use Topics   Alcohol use: Yes    Comment: ocassionally   Drug use: Not Currently    Family History  Problem Relation Age of Onset   Hypertension Mother    Heart disease Mother    Hypertension Father    Heart disease Father         07/20/2024    8:32 AM  GAD 7 : Generalized Anxiety Score  Nervous, Anxious, on Edge 0  Control/stop worrying 0  Worry too much - different things 0  Trouble relaxing 0  Restless 0  Easily annoyed or irritable 0  Afraid - awful might happen 0  Total GAD 7 Score 0  Anxiety Difficulty Not difficult at all       07/20/2024    8:32 AM  Depression screen PHQ 2/9  Decreased Interest 0  Down, Depressed, Hopeless 0  PHQ - 2 Score 0  BP Readings from Last 3 Encounters:  07/20/24 (!) 158/96  01/01/19 (!) 138/98  02/02/15 (!) 139/91    Wt Readings from Last 3 Encounters:  07/20/24 225 lb (102.1 kg)  01/01/19 200 lb (90.7 kg)  02/02/15 220 lb (99.8 kg)    BP (!) 158/96 (Cuff Size: Large)   Pulse 97   Temp 98.7 F (37.1 C)   Ht 6' 2 (1.88 m)   Wt 225 lb (102.1 kg)   SpO2 99%   BMI 28.89 kg/m   Physical Exam Vitals and nursing note reviewed.  Constitutional:      Appearance: Normal appearance.  Cardiovascular:     Rate and Rhythm: Normal rate and regular rhythm.     Heart sounds: No murmur heard.    No friction rub. No gallop.  Pulmonary:     Effort: Pulmonary effort is normal.     Breath sounds: Normal breath sounds.  Abdominal:     General: There is no distension.  Musculoskeletal:        General: Normal range of motion.     Comments: 2+ non-pitting edema of  the left ankle, TTP with mild erythema. Mild edema and erythema of the left calf but not particularly warm or tender. Left knee also slightly tender without warmth or erythema.  Calf circumference 40.5 cm on left compared to 38.5 cm on right.   Skin:    General: Skin is warm and dry.  Neurological:     Mental Status: He is alert and oriented to person, place, and time.     Gait: Gait is intact.  Psychiatric:        Mood and Affect: Mood and affect normal.     Recent Labs     Component Value Date/Time   NA 137 01/01/2019 0845   K 3.3 (L) 01/01/2019 0845   CL 103 01/01/2019 0845   CO2 20 (L) 01/01/2019 0845   GLUCOSE 125 (H) 01/01/2019 0845   BUN 6 01/01/2019 0845   CREATININE 1.00 01/01/2019 0845   CALCIUM 8.9 01/01/2019 0845   PROT 7.6 01/01/2019 0845   ALBUMIN 4.4 01/01/2019 0845   AST 42 (H) 01/01/2019 0845   ALT 33 01/01/2019 0845   ALKPHOS 67 01/01/2019 0845   BILITOT 0.7 01/01/2019 0845   GFRNONAA >60 01/01/2019 0845   GFRAA >60 01/01/2019 0845    Lab Results  Component Value Date   WBC 12.0 (H) 01/01/2019   HGB 16.1 01/01/2019   HCT 46.1 01/01/2019   MCV 89.5 01/01/2019   PLT 212 01/01/2019   No results found for: HGBA1C No results found for: CHOL, HDL, LDLCALC, LDLDIRECT, TRIG, CHOLHDL No results found for: TSH    Assessment and Plan:  1. Acute gout of multiple sites, unspecified cause (Primary) Immediately begin treatment with colchicine  and ibuprofen  as directed.  Refilling allopurinol , though we discussed that allopurinol  is strictly for prevention and not for acute treatment.  Reviewed best and worst foods for gout.  Check CBC, CMP, uric acid.  - allopurinol  (ZYLOPRIM ) 300 MG tablet; Take 1 tablet (300 mg total) by mouth daily.  Dispense: 90 tablet; Refill: 1 - colchicine  0.6 MG tablet; Take 1 tablet (0.6 mg total) by mouth as needed (Take only as directed at the onset of gout flare). Day 1: Take 2 tablets at once. After 1 hour, take a  third tablet. Days 2-5: Take 1 tablet every 12h until flare resolved  Dispense: 15 tablet; Refill: 1 - ibuprofen  (ADVIL ) 800 MG tablet;  Take 1 tablet (800 mg total) by mouth every 8 (eight) hours as needed.  Dispense: 30 tablet; Refill: 1 - CBC with Differential/Platelet - Comprehensive metabolic panel with GFR - Uric acid  2. Pain of left calf Probably related to the acute gout flare, but given the redness, edema, and tenderness we discussed that it is important to also consider DVT is a possibility though I feel this is less likely. Wells score 2.   Will check a CBC and D-dimer.    If symptoms rapidly worsen or if not improving within 24-48h he has been advised to contact the clinic for stat ultrasound imaging or seek care at the emergency room.  He verbalizes understanding.  - CBC with Differential/Platelet - Comprehensive metabolic panel with GFR - D-Dimer, Quantitative  3. Elevated blood pressure reading in office without diagnosis of hypertension Patient states that his blood pressure is always high when in clinic, but also does not monitor blood pressure at home.  Advised purchasing or borrowing a home blood pressure cuff and monitoring at home to ensure that his blood pressure does not remain this high all the time.  If he does have hypertension at baseline, would likely benefit from losartan given his comorbid gout.  - Comprehensive metabolic panel with GFR   We discussed that with or without insurance, I am happy to see him anytime.  However, he might consider following up with the [facility] in [place] if finances are of concern.  All medications were sent to [facility] for optimal pricing.   Suggest follow-up in 1 to 2 months, either here or at [facility].    Rolan Hoyle, PA-C, DMSc, Nutritionist [facility] [phone]

## 2024-07-28 ENCOUNTER — Telehealth: Payer: Self-pay | Admitting: Physician Assistant

## 2024-07-28 NOTE — Telephone Encounter (Signed)
 Spoke with patient via phone to remind him to complete his lab work when able.  D-dimer was expensive for him as a self-pay patient.  DVT is low on my differential, but still being considered.  Patient reports that after treating with colchicine  and restarting allopurinol , his symptoms much improved, though he still has some mild calf soreness.  He intends to complete his lab work next week, with the exception of D-dimer.

## 2024-08-03 ENCOUNTER — Other Ambulatory Visit: Payer: Self-pay

## 2024-08-13 ENCOUNTER — Other Ambulatory Visit: Payer: Self-pay

## 2024-08-15 ENCOUNTER — Other Ambulatory Visit: Payer: Self-pay
